# Patient Record
Sex: Male | Born: 1965 | Race: Black or African American | Hispanic: No | Marital: Single | Smoking: Current every day smoker
Health system: Southern US, Community
[De-identification: ages and names within clinical notes are randomized; demographics above are authoritative.]

## PROBLEM LIST (undated history)

## (undated) DIAGNOSIS — I10 Essential (primary) hypertension: Secondary | ICD-10-CM

---

## 2018-01-18 ENCOUNTER — Other Ambulatory Visit: Payer: Self-pay

## 2018-01-18 ENCOUNTER — Emergency Department (HOSPITAL_COMMUNITY)
Admission: EM | Admit: 2018-01-18 | Discharge: 2018-01-18 | Disposition: A | Discharge status: Home / Self Care | Payer: Self-pay | Attending: Emergency Medicine | Admitting: Emergency Medicine

## 2018-01-18 ENCOUNTER — Encounter (HOSPITAL_COMMUNITY): Payer: Self-pay

## 2018-01-18 DIAGNOSIS — I10 Essential (primary) hypertension: Secondary | ICD-10-CM | POA: Insufficient documentation

## 2018-01-18 DIAGNOSIS — F17200 Nicotine dependence, unspecified, uncomplicated: Secondary | ICD-10-CM | POA: Insufficient documentation

## 2018-01-18 DIAGNOSIS — S39012A Strain of muscle, fascia and tendon of lower back, initial encounter: Secondary | ICD-10-CM | POA: Insufficient documentation

## 2018-01-18 DIAGNOSIS — Y929 Unspecified place or not applicable: Secondary | ICD-10-CM | POA: Insufficient documentation

## 2018-01-18 DIAGNOSIS — Y990 Civilian activity done for income or pay: Secondary | ICD-10-CM | POA: Insufficient documentation

## 2018-01-18 DIAGNOSIS — Y9389 Activity, other specified: Secondary | ICD-10-CM | POA: Insufficient documentation

## 2018-01-18 DIAGNOSIS — X500XXA Overexertion from strenuous movement or load, initial encounter: Secondary | ICD-10-CM | POA: Insufficient documentation

## 2018-01-18 HISTORY — DX: Essential (primary) hypertension: I10

## 2018-01-18 MED ORDER — IBUPROFEN 800 MG PO TABS: 800.0000 mg | ORAL_TABLET | Freq: Four times a day (QID) | ORAL | 0 refills | Status: DC | PRN

## 2018-01-18 MED ORDER — CYCLOBENZAPRINE HCL 10 MG PO TABS: 10.0000 mg | ORAL_TABLET | Freq: Three times a day (TID) | ORAL | 0 refills | Status: AC | PRN
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
## 2018-01-18 NOTE — ED Provider Notes (Signed)
[facility]-EMERGENCY DEPT Provider Note   CSN: [id number] Arrival date & time: 01/18/18  1921     History   Chief Complaint Chief Complaint  Patient presents with  . Back Pain    HPI Vincent Roman is a 52 y.o. male.  Patient presents to the emergency department for evaluation of back pain.  He reports that he has been having aching pain in the lower back for the last week.  He denies any direct injury.  He does, however, report that he lifts boxes all day at work and this causes his pain to worsen.  Pain does not radiate to the lower back, no weakness in the legs.  No numbness, tingling, no change in bowel or bladder function.  He thinks he needs a note for light duty so he can heel.     Past Medical History:  Diagnosis Date  . Hypertension     There are no active problems to display for this patient.   History reviewed. No pertinent surgical history.      Home Medications    Prior to Admission medications   Medication Sig Start Date End Date Taking? Authorizing Provider  cyclobenzaprine (FLEXERIL) 10 MG tablet Take 1 tablet (10 mg total) by mouth 3 (three) times daily as needed for muscle spasms. 01/18/18   Gilda CreasePollina, Clorene Nerio J, MD  ibuprofen (ADVIL,MOTRIN) 800 MG tablet Take 1 tablet (800 mg total) by mouth every 6 (six) hours as needed for moderate pain. 01/18/18   Gilda CreasePollina, Rubylee Zamarripa J, MD    Family History History reviewed. No pertinent family history.  Social History Social History   Tobacco Use  . Smoking status: Current Every Day Smoker  . Smokeless tobacco: Never Used  Substance Use Topics  . Alcohol use: Not on file  . Drug use: Not on file     Allergies   Patient has no known allergies.   Review of Systems Review of Systems  Musculoskeletal: Positive for back pain.  All other systems reviewed and are negative.    Physical Exam Updated Vital Signs BP (!) 146/98 (BP Location: Right Arm)   Pulse 71   Temp 98.1  F (36.7 C) (Oral)   Resp 14   SpO2 100%   Physical Exam  Constitutional: He is oriented to person, place, and time. He appears well-developed and well-nourished. No distress.  HENT:  Head: Normocephalic and atraumatic.  Right Ear: Hearing normal.  Left Ear: Hearing normal.  Nose: Nose normal.  Mouth/Throat: Oropharynx is clear and moist and mucous membranes are normal.  Eyes: Pupils are equal, round, and reactive to light. Conjunctivae and EOM are normal.  Neck: Normal range of motion. Neck supple.  Cardiovascular: Regular rhythm, S1 normal and S2 normal. Exam reveals no gallop and no friction rub.  No murmur heard. Pulmonary/Chest: Effort normal and breath sounds normal. No respiratory distress. He exhibits no tenderness.  Abdominal: Soft. Normal appearance and bowel sounds are normal. There is no hepatosplenomegaly. There is no tenderness. There is no rebound, no guarding, no tenderness at McBurney's point and negative Murphy's sign. No hernia.  Musculoskeletal: Normal range of motion.       Lumbar back: He exhibits tenderness.       Back:  Neurological: He is alert and oriented to person, place, and time. He has normal strength. No cranial nerve deficit or sensory deficit. Coordination normal. GCS eye subscore is 4. GCS verbal subscore is 5. GCS motor subscore is 6.  Skin: Skin is  warm, dry and intact. No rash noted. No cyanosis.  Psychiatric: He has a normal mood and affect. His speech is normal and behavior is normal. Thought content normal.  Nursing note and vitals reviewed.    ED Treatments / Results  Labs (all labs ordered are listed, but only abnormal results are displayed) Labs Reviewed - No data to display  EKG None  Radiology No results found.  Procedures Procedures (including critical care time)  Medications Ordered in ED Medications - No data to display   Initial Impression / Assessment and Plan / ED Course  I have reviewed the triage vital signs and  the nursing notes.  Pertinent labs & imaging results that were available during my care of the patient were reviewed by me and considered in my medical decision making (see chart for details).     Patient presents to the ER with musculoskeletal back pain. Examination reveals back tenderness without any associated neurologic findings. Patient's strength, sensation and reflexes were normal. There is no evidence of saddle anesthesia. Patient does not have a foot drop. Patient has not experienced any change in bowel or bladder function. As such, patient did not require any imaging or further studies. Patient was treated with analgesia.  Final Clinical Impressions(s) / ED Diagnoses   Final diagnoses:  Strain of lumbar region, initial encounter    ED Discharge Orders         Ordered    ibuprofen (ADVIL,MOTRIN) 800 MG tablet  Every 6 hours PRN     01/18/18 2314    cyclobenzaprine (FLEXERIL) 10 MG tablet  3 times daily PRN     01/18/18 2314           Gilda Crease, MD 01/18/18 2314

## 2018-01-30 ENCOUNTER — Emergency Department (HOSPITAL_COMMUNITY)
Admission: EM | Admit: 2018-01-30 | Discharge: 2018-01-30 | Disposition: A | Discharge status: Home / Self Care | Payer: Self-pay | Attending: Emergency Medicine | Admitting: Emergency Medicine

## 2018-01-30 ENCOUNTER — Other Ambulatory Visit: Payer: Self-pay

## 2018-01-30 ENCOUNTER — Encounter (HOSPITAL_COMMUNITY): Payer: Self-pay | Admitting: *Deleted

## 2018-01-30 DIAGNOSIS — M5442 Lumbago with sciatica, left side: Secondary | ICD-10-CM | POA: Insufficient documentation

## 2018-01-30 DIAGNOSIS — Z79899 Other long term (current) drug therapy: Secondary | ICD-10-CM | POA: Insufficient documentation

## 2018-01-30 DIAGNOSIS — I10 Essential (primary) hypertension: Secondary | ICD-10-CM | POA: Insufficient documentation

## 2018-01-30 DIAGNOSIS — F17210 Nicotine dependence, cigarettes, uncomplicated: Secondary | ICD-10-CM | POA: Insufficient documentation

## 2018-01-30 MED ORDER — DICLOFENAC EPOLAMINE 1.3 % TD PTCH
1.0000 | MEDICATED_PATCH | Freq: Two times a day (BID) | TRANSDERMAL | Status: DC
  Administered 2018-01-30: 1 via TRANSDERMAL
  Filled 2018-01-30: qty 1

## 2018-01-30 MED ORDER — TRAMADOL HCL 50 MG PO TABS
50.0000 mg | ORAL_TABLET | Freq: Once | ORAL | Status: AC
  Administered 2018-01-30: 50 mg via ORAL
  Filled 2018-01-30: qty 1

## 2018-01-30 MED ORDER — PREDNISONE 20 MG PO TABS
60.0000 mg | ORAL_TABLET | ORAL | Status: AC
  Administered 2018-01-30: 60 mg via ORAL
  Filled 2018-01-30: qty 3

## 2018-01-30 MED ORDER — TRAMADOL HCL 50 MG PO TABS: 50.0000 mg | ORAL_TABLET | Freq: Four times a day (QID) | ORAL | 0 refills | Status: AC | PRN

## 2018-01-30 MED ORDER — PREDNISONE 20 MG PO TABS: 40.0000 mg | ORAL_TABLET | Freq: Every day | ORAL | 0 refills | Status: AC

## 2018-01-30 NOTE — ED Provider Notes (Signed)
[facility]-EMERGENCY DEPT Provider Note   CSN: [id number] Arrival date & time: 01/30/18  2116     History   Chief Complaint Chief Complaint  Patient presents with  . Back Pain    HPI Vincent Roman is a 52 y.o. male.  HPI   Presents after sudden onset left lower back pain. Onset was after lifting a heavy object. Since onset pain is been sharp, severe, with mild radiation down the left leg. No abdominal pain, no urinary complaints, no fever, chills. Patient has history of prior exacerbation similar, but had been well prior to this episode. Since onset no relief with OTC medication.  Past Medical History:  Diagnosis Date  . Hypertension     There are no active problems to display for this patient.   History reviewed. No pertinent surgical history.      Home Medications    Prior to Admission medications   Medication Sig Start Date End Date Taking? Authorizing Provider  cyclobenzaprine (FLEXERIL) 10 MG tablet Take 1 tablet (10 mg total) by mouth 3 (three) times daily as needed for muscle spasms. 01/18/18   Gilda Crease, MD  ibuprofen (ADVIL,MOTRIN) 800 MG tablet Take 1 tablet (800 mg total) by mouth every 6 (six) hours as needed for moderate pain. 01/18/18   Gilda Crease, MD  predniSONE (DELTASONE) 20 MG tablet Take 2 tablets (40 mg total) by mouth daily with breakfast. For the next four days 01/30/18   Gerhard Munch, MD  traMADol (ULTRAM) 50 MG tablet Take 1 tablet (50 mg total) by mouth every 6 (six) hours as needed. 01/30/18   Gerhard Munch, MD    Family History No family history on file.  Social History Social History   Tobacco Use  . Smoking status: Current Every Day Smoker  . Smokeless tobacco: Never Used  Substance Use Topics  . Alcohol use: Not on file  . Drug use: Not on file     Allergies   Patient has no known allergies.   Review of Systems Review of Systems  Constitutional:       Per HPI,  otherwise negative  HENT:       Per HPI, otherwise negative  Respiratory:       Per HPI, otherwise negative  Cardiovascular:       Per HPI, otherwise negative  Gastrointestinal: Negative for vomiting.  Endocrine:       Negative aside from HPI  Genitourinary:       Neg aside from HPI   Musculoskeletal:       Per HPI, otherwise negative  Skin: Negative.   Neurological: Negative for syncope.     Physical Exam Updated Vital Signs BP (!) 180/108   Pulse (!) 58   Temp 97.8 F (36.6 C) (Oral)   Resp 17   SpO2 100%   Physical Exam  Constitutional: He is oriented to person, place, and time. He appears well-developed. No distress.  HENT:  Head: Normocephalic and atraumatic.  Eyes: Conjunctivae and EOM are normal.  Cardiovascular: Normal rate and regular rhythm.  Pulmonary/Chest: Effort normal. No stridor. No respiratory distress.  Abdominal: He exhibits no distension.  Musculoskeletal: He exhibits no edema.  Patient can flex each hip independently, but has pain referred to the left lower back greater on the left than the right.  Neurological: He is alert and oriented to person, place, and time.  Skin: Skin is warm and dry.  Psychiatric: He has a normal mood and affect.  Nursing note and vitals reviewed.    ED Treatments / Results   Procedures Procedures (including critical care time)  Medications Ordered in ED Medications  diclofenac (FLECTOR) 1.3 % 1 patch (has no administration in time range)  predniSONE (DELTASONE) tablet 60 mg (has no administration in time range)  traMADol (ULTRAM) tablet 50 mg (has no administration in time range)     Initial Impression / Assessment and Plan / ED Course  I have reviewed the triage vital signs and the nursing notes.  Pertinent labs & imaging results that were available during my care of the patient were reviewed by me and considered in my medical decision making (see chart for details).     Patient presents with episode of  acute onset left lower back pain with radicular features. Patient is neurovascular intact, has no red flags for CNS injury. Patient likely having exacerbation of her radicular lesions, given his pain, reproducible pain with hip flexion, with reassuring physical exam otherwise, patient started on anti-inflammatories, topical and oral, pain medication and provided resources to follow-up.  Final Clinical Impressions(s) / ED Diagnoses   Final diagnoses:  Acute left-sided low back pain with left-sided sciatica    ED Discharge Orders         Ordered    predniSONE (DELTASONE) 20 MG tablet  Daily with breakfast     01/30/18 2305    traMADol (ULTRAM) 50 MG tablet  Every 6 hours PRN     01/30/18 2305           Gerhard Munch, MD 01/30/18 2313

## 2018-01-30 NOTE — Discharge Instructions (Signed)
As discussed, your evaluation today has been largely reassuring.  But, it is important that you monitor your condition carefully, and do not hesitate to return to the ED if you develop new, or concerning changes in your condition.  In addition to the prescribed medication, please obtain a topical medication patches including the ingredient dimethyl salicylate. You may need to request the assistance of the pharmacist to obtain his, but they are available over-the-counter.

## 2018-01-30 NOTE — ED Triage Notes (Signed)
Pt reports onset of right lower back pain while at work tonight, cannot recall specific injury to the area. Ambulatory to triage with steady gait.

## 2018-02-18 ENCOUNTER — Emergency Department (HOSPITAL_COMMUNITY)
Admission: EM | Admit: 2018-02-18 | Discharge: 2018-02-18 | Disposition: A | Discharge status: Home / Self Care | Payer: Self-pay | Attending: Emergency Medicine | Admitting: Emergency Medicine

## 2018-02-18 ENCOUNTER — Other Ambulatory Visit: Payer: Self-pay

## 2018-02-18 DIAGNOSIS — I10 Essential (primary) hypertension: Secondary | ICD-10-CM | POA: Insufficient documentation

## 2018-02-18 DIAGNOSIS — Z79899 Other long term (current) drug therapy: Secondary | ICD-10-CM | POA: Insufficient documentation

## 2018-02-18 DIAGNOSIS — J069 Acute upper respiratory infection, unspecified: Secondary | ICD-10-CM

## 2018-02-18 DIAGNOSIS — F17200 Nicotine dependence, unspecified, uncomplicated: Secondary | ICD-10-CM | POA: Insufficient documentation

## 2018-02-18 DIAGNOSIS — B9789 Other viral agents as the cause of diseases classified elsewhere: Secondary | ICD-10-CM

## 2018-02-18 NOTE — ED Provider Notes (Signed)
[facility]-EMERGENCY DEPT Provider Note   CSN: [id number] Arrival date & time: 02/18/18  1537     History   Chief Complaint Chief Complaint  Patient presents with  . Sore Throat  . Nasal Congestion  . Cough    HPI  Vincent Roman is a 52 y.o. male who complains of sore throat, productive cough and malaise  for 1 days. He denies a history of chest pain, dizziness, fevers, myalgias, nausea, rash, shortness of breath, sweats, vomiting, weakness, weight loss and wheezing and denies a history of asthma. Patient admits to smoke cigarettes.  He states "I just need a shot or some the skin to get rid of the skull because I got a go to work."  He has not taken anything for his symptoms.  States that multiple people at his job have the same symptoms.    HPI  Past Medical History:  Diagnosis Date  . Hypertension     There are no active problems to display for this patient.   No past surgical history on file.      Home Medications    Prior to Admission medications   Medication Sig Start Date End Date Taking? Authorizing Provider  cyclobenzaprine (FLEXERIL) 10 MG tablet Take 1 tablet (10 mg total) by mouth 3 (three) times daily as needed for muscle spasms. 01/18/18   Gilda Crease, MD  ibuprofen (ADVIL,MOTRIN) 800 MG tablet Take 1 tablet (800 mg total) by mouth every 6 (six) hours as needed for moderate pain. 01/18/18   Gilda Crease, MD  predniSONE (DELTASONE) 20 MG tablet Take 2 tablets (40 mg total) by mouth daily with breakfast. For the next four days 01/30/18   Gerhard Munch, MD  traMADol (ULTRAM) 50 MG tablet Take 1 tablet (50 mg total) by mouth every 6 (six) hours as needed. 01/30/18   Gerhard Munch, MD    Family History No family history on file.  Social History Social History   Tobacco Use  . Smoking status: Current Every Day Smoker  . Smokeless tobacco: Never Used  Substance Use Topics  . Alcohol use: Not on file  .  Drug use: Not on file     Allergies   Patient has no known allergies.   Review of Systems Review of Systems Ten systems reviewed and are negative for acute change, except as noted in the HPI.     Physical Exam Updated Vital Signs BP (!) 161/94 (BP Location: Right Arm)   Pulse 60   Temp 98.8 F (37.1 C) (Oral)   Resp 18   Ht 5\' 7"  (1.702 m)   Wt 90.7 kg   SpO2 99%   BMI 31.32 kg/m   Physical Exam  Constitutional: He is oriented to person, place, and time. He appears well-developed and well-nourished. No distress.  HENT:  Head: Normocephalic and atraumatic.  Right Ear: Tympanic membrane and ear canal normal.  Left Ear: Tympanic membrane and ear canal normal.  Mouth/Throat: Uvula is midline, oropharynx is clear and moist and mucous membranes are normal. No oral lesions. No uvula swelling. No oropharyngeal exudate or posterior oropharyngeal erythema.  Eyes: Pupils are equal, round, and reactive to light. Conjunctivae and EOM are normal. No scleral icterus.  Neck: Normal range of motion. Neck supple.  Cardiovascular: Normal rate, regular rhythm and normal heart sounds.  Pulmonary/Chest: Effort normal. No respiratory distress. He has no wheezes. He has no rhonchi. He has no rales.  Abdominal: Soft. There is no tenderness.  Musculoskeletal: He exhibits no edema.  Neurological: He is alert and oriented to person, place, and time.  Skin: Skin is warm and dry. Capillary refill takes less than 2 seconds. He is not diaphoretic.  Psychiatric: His behavior is normal.  Nursing note and vitals reviewed.    ED Treatments / Results  Labs (all labs ordered are listed, but only abnormal results are displayed) Labs Reviewed - No data to display  EKG None  Radiology No results found.  Procedures Procedures (including critical care time)  Medications Ordered in ED Medications - No data to display   Initial Impression / Assessment and Plan / ED Course  I have reviewed the  triage vital signs and the nursing notes.  Pertinent labs & imaging results that were available during my care of the patient were reviewed by me and considered in my medical decision making (see chart for details).      Patients symptoms are consistent with URI, likely viral etiology. Discussed that antibiotics are not indicated for viral infections. Pt will be discharged with symptomatic treatment.  Verbalizes understanding and is agreeable with plan. Pt is hemodynamically stable & in NAD prior to dc.   Final Clinical Impressions(s) / ED Diagnoses   Final diagnoses:  Viral URI with cough    ED Discharge Orders    None       Arthor Captain, PA-C 02/18/18 2202    Maia Plan, MD 02/19/18 [phone]

## 2018-02-18 NOTE — ED Notes (Signed)
Pt is alert and oriented x 4 and is verbally responsive. Pt report a 7/10 sore throat pain x 2 days. Pt throat appears mildly red on the left side.

## 2018-02-18 NOTE — Discharge Instructions (Addendum)
Use DayQuil/NyQuil for symptom relief. Follow-up with your primary care physician.  Use immune system boosting such as vitamin C and zinc.  There are multiple available products over-the-counter and you may ask any pharmacist for help with these medications.

## 2018-02-18 NOTE — ED Triage Notes (Signed)
Patient arrives with c/o sore throat and nasal congestion that started yesterday. Patient also reports a mild cough with clear sputum production. Patient denies fevers.

## 2019-10-15 ENCOUNTER — Encounter (HOSPITAL_COMMUNITY): Payer: Self-pay | Admitting: Emergency Medicine

## 2019-10-15 ENCOUNTER — Emergency Department (HOSPITAL_COMMUNITY): Payer: Self-pay

## 2019-10-15 ENCOUNTER — Emergency Department (HOSPITAL_COMMUNITY)
Admission: EM | Admit: 2019-10-15 | Discharge: 2019-10-15 | Disposition: A | Discharge status: Home / Self Care | Payer: Self-pay | Attending: Emergency Medicine | Admitting: Emergency Medicine

## 2019-10-15 DIAGNOSIS — S3992XA Unspecified injury of lower back, initial encounter: Secondary | ICD-10-CM | POA: Insufficient documentation

## 2019-10-15 DIAGNOSIS — I10 Essential (primary) hypertension: Secondary | ICD-10-CM | POA: Insufficient documentation

## 2019-10-15 DIAGNOSIS — M545 Low back pain, unspecified: Secondary | ICD-10-CM

## 2019-10-15 DIAGNOSIS — Y990 Civilian activity done for income or pay: Secondary | ICD-10-CM | POA: Insufficient documentation

## 2019-10-15 DIAGNOSIS — Y9389 Activity, other specified: Secondary | ICD-10-CM | POA: Insufficient documentation

## 2019-10-15 DIAGNOSIS — Y929 Unspecified place or not applicable: Secondary | ICD-10-CM | POA: Insufficient documentation

## 2019-10-15 DIAGNOSIS — X500XXA Overexertion from strenuous movement or load, initial encounter: Secondary | ICD-10-CM | POA: Insufficient documentation

## 2019-10-15 DIAGNOSIS — F17200 Nicotine dependence, unspecified, uncomplicated: Secondary | ICD-10-CM | POA: Insufficient documentation

## 2019-10-15 MED ORDER — IBUPROFEN 800 MG PO TABS: 800.0000 mg | ORAL_TABLET | Freq: Three times a day (TID) | ORAL | 0 refills | Status: AC

## 2019-10-15 MED ORDER — LIDOCAINE 5 % EX PTCH
1.0000 | MEDICATED_PATCH | CUTANEOUS | Status: DC
  Administered 2019-10-15: 1 via TRANSDERMAL
  Filled 2019-10-15: qty 1

## 2019-10-15 MED ORDER — KETOROLAC TROMETHAMINE 30 MG/ML IJ SOLN
30.0000 mg | Freq: Once | INTRAMUSCULAR | Status: AC
  Administered 2019-10-15: 30 mg via INTRAMUSCULAR
  Filled 2019-10-15: qty 1

## 2019-10-15 MED ORDER — METHOCARBAMOL 500 MG PO TABS: 500.0000 mg | ORAL_TABLET | Freq: Two times a day (BID) | ORAL | 0 refills | Status: AC

## 2019-10-15 NOTE — ED Provider Notes (Signed)
[facility] DEPT Provider Note   CSN: [id number] Arrival date & time: 10/15/19  7829     History Chief Complaint  Patient presents with  . Back Pain  . Medication Refill    Vincent Roman is a 54 y.o. male.  Vincent Roman is a 54 y.o. male with a history of hypertension, who presents to the ED for evaluation after back injury.  He reports that about a week ago he was lifting heavy pallets at work and started to have pain in the middle of his low back.  He reports that they put him on light duty for 2 days and his back pain seems to be improving but then they put him back in his usual site lifting heavy pallets and his back pain started to worsen again.  He reports pain is localized in the middle of his low back it does not radiate and does not shoot into the legs.  He denies any associated numbness, weakness or tingling.  No associated abdominal pain or urinary symptoms.  No loss of bowel or bladder control or saddle anesthesia.  No history of cancer or IV drug use.  No prior history of back injury or surgery.  He has been taking ibuprofen with some relief, has not tried anything else to treat his symptoms.  No other aggravating or alleviating factors.        Past Medical History:  Diagnosis Date  . Hypertension     There are no problems to display for this patient.   History reviewed. No pertinent surgical history.     No family history on file.  Social History   Tobacco Use  . Smoking status: Current Every Day Smoker  . Smokeless tobacco: Never Used  Substance Use Topics  . Alcohol use: Not on file  . Drug use: Not on file    Home Medications Prior to Admission medications   Medication Sig Start Date End Date Taking? Authorizing Provider  cyclobenzaprine (FLEXERIL) 10 MG tablet Take 1 tablet (10 mg total) by mouth 3 (three) times daily as needed for muscle spasms. 01/18/18   Orpah Greek, MD  ibuprofen (ADVIL) 800 MG  tablet Take 1 tablet (800 mg total) by mouth 3 (three) times daily. 10/15/19   Jacqlyn Larsen, PA-C  methocarbamol (ROBAXIN) 500 MG tablet Take 1 tablet (500 mg total) by mouth 2 (two) times daily. 10/15/19   Jacqlyn Larsen, PA-C  predniSONE (DELTASONE) 20 MG tablet Take 2 tablets (40 mg total) by mouth daily with breakfast. For the next four days 01/30/18   Carmin Muskrat, MD  traMADol (ULTRAM) 50 MG tablet Take 1 tablet (50 mg total) by mouth every 6 (six) hours as needed. 01/30/18   Carmin Muskrat, MD    Allergies    Patient has no known allergies.  Review of Systems   Review of Systems  Constitutional: Negative for chills and fever.  HENT: Negative.   Respiratory: Negative for shortness of breath.   Cardiovascular: Negative for chest pain.  Gastrointestinal: Negative for abdominal pain, constipation, diarrhea, nausea and vomiting.  Genitourinary: Negative for dysuria, flank pain, frequency and hematuria.  Musculoskeletal: Positive for back pain. Negative for arthralgias, gait problem, joint swelling, myalgias and neck pain.  Skin: Negative for color change, rash and wound.  Neurological: Negative for weakness and numbness.    Physical Exam Updated Vital Signs BP (!) 150/90 (BP Location: Left Arm) Comment: out of meds  Pulse 72   Temp 98.1  F (36.7 C) (Oral)   Resp 16   SpO2 100%   Physical Exam Vitals and nursing note reviewed.  Constitutional:      General: He is not in acute distress.    Appearance: He is well-developed. He is not diaphoretic.  HENT:     Head: Atraumatic.  Eyes:     General:        Right eye: No discharge.        Left eye: No discharge.  Cardiovascular:     Pulses:          Radial pulses are 2+ on the right side and 2+ on the left side.       Dorsalis pedis pulses are 2+ on the right side and 2+ on the left side.       Posterior tibial pulses are 2+ on the right side and 2+ on the left side.  Pulmonary:     Effort: Pulmonary effort is normal. No  respiratory distress.  Abdominal:     General: Bowel sounds are normal. There is no distension.     Palpations: Abdomen is soft. There is no mass.     Tenderness: There is no abdominal tenderness. There is no guarding.     Comments: Abdomen soft, nondistended, nontender to palpation in all quadrants without guarding or peritoneal signs, no CVA tenderness bilaterally  Musculoskeletal:     Cervical back: Neck supple.     Comments: Tenderness to palpation over midline low back pain without palpable deformity or stepoff.  Pain made worse with range of motion of the lower extremities, but neg straight leg raise  Skin:    General: Skin is warm and dry.     Capillary Refill: Capillary refill takes less than 2 seconds.  Neurological:     Mental Status: He is alert and oriented to person, place, and time.     Comments: Alert, clear speech, following commands. Moving all extremities without difficulty. Bilateral lower extremities with 5/5 strength in proximal and distal muscle groups and with dorsi and plantar flexion. Sensation intact in bilateral lower extremities. Ambulatory with steady gait  Psychiatric:        Mood and Affect: Mood normal.        Behavior: Behavior normal.     ED Results / Procedures / Treatments   Labs (all labs ordered are listed, but only abnormal results are displayed) Labs Reviewed - No data to display  EKG None  Radiology DG Lumbar Spine Complete  Result Date: 10/15/2019 CLINICAL DATA:  Back pain. EXAM: LUMBAR SPINE - COMPLETE 4+ VIEW COMPARISON:  None. FINDINGS: There is no acute displaced fracture. No significant malalignment. There are advanced degenerative changes at the L4-L5 and L5-S1 levels. There is facet arthrosis at the lower lumbar segments. Phleboliths project over the patient's pelvis. IMPRESSION: No acute displaced fracture or significant malalignment. 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 Electronically Signed   By: Katherine Mantle  M.D.   On: 10/15/2019 19:45    Procedures Procedures (including critical care time)  Medications Ordered in ED Medications  ketorolac (TORADOL) 30 MG/ML injection 30 mg (has no administration in time range)  lidocaine (LIDODERM) 5 % 1 patch (has no administration in time range)    ED Course  I have reviewed the triage vital signs and the nursing notes.  Pertinent labs & imaging results that were available during my care of the patient were reviewed by me and considered in my medical decision making (see chart  for details).    MDM Rules/Calculators/A&P                          Patient with back pain.  No neurological deficits and normal neuro exam.  Patient can walk but states is painful.  No loss of bowel or bladder control.  No concern for cauda equina.  No fever, night sweats, weight loss, h/o cancer, IVDU.  X-ray without evidence of acute fracture or malalignment, does show some degenerative changes.  RICE protocol and pain medicine indicated and discussed with patient.   Final Clinical Impression(s) / ED Diagnoses Final diagnoses:  Acute midline low back pain without sciatica    Rx / DC Orders ED Discharge Orders         Ordered    ibuprofen (ADVIL) 800 MG tablet  3 times daily     Discontinue  Reprint     10/15/19 2005    methocarbamol (ROBAXIN) 500 MG tablet  2 times daily     Discontinue  Reprint     10/15/19 2005           Dartha Lodge, Cordelia Poche 10/15/19 2007    Terald Sleeper, MD 10/16/19 1037

## 2019-10-15 NOTE — Discharge Instructions (Signed)
You were seen here today for Back Pain: Low back pain is discomfort in the lower back that may be due to injuries to muscles and ligaments around the spine. Occasionally, it may be caused by a problem to a part of the spine called a disc. Your back pain should be treated with medicines listed below as well as back exercises and this back pain should get better over the next 2 weeks. Most patients get completely well in 4 weeks. It is important to know however, if you develop severe or worsening pain, low back pain with fever, numbness, weakness or inability to walk or urinate, you should return to the ER immediately.  Please follow up with your doctor this week for a recheck if still having symptoms.  HOME INSTRUCTIONS Self - care:  The application of heat can help soothe the pain.  Maintaining your daily activities, including walking (this is encouraged), as it will help you get better faster than just staying in bed. Do not life, push, pull anything more than 10 pounds for the next week. I am attaching back exercises that you can do at home to help facilitate your recovery.   Back Exercises - I have attached a handout on back exercises that can be done at home to help facilitate your recovery.   Medications are also useful to help with pain control.   Acetaminophen.  This medication is generally safe, and found over the counter. Take as directed for your age. You should not take more than 8 of the extra strength (500mg ) pills a day (max dose is 4000mg  total OVER one day)  Non steroidal anti inflammatory: This includes medications including Ibuprofen, naproxen and Mobic; These medications help both pain and swelling and are very useful in treating back pain.  They should be taken with food, as they can cause stomach upset, and more seriously, stomach bleeding. Do not combine the medications.   Lidocaine Patch: Salon Pas lidocaine patches (blue and silver box) can be purchased over the counter and worn  for 12 hours for local pain relief   Muscle relaxants:  These medications can help with muscle tightness that is a cause of lower back pain.  Most of these medications can cause drowsiness, and it is not safe to drive or use dangerous machinery while taking them. They are primarily helpful when taken at night before sleep.   You will need to follow up with your primary healthcare provider  or the Orthopedist in 1-2 weeks for reassessment and persistent symptoms.  Be aware that if you develop new symptoms, such as a fever, leg weakness, difficulty with or loss of control of your urine or bowels, abdominal pain, or more severe pain, you will need to seek medical attention and/or return to the Emergency department. Additional Information:  Your vital signs today were: BP (!) 150/90 (BP Location: Left Arm) Comment: out of meds  Pulse 72   Temp 98.1 F (36.7 C) (Oral)   Resp 16   SpO2 100%  If your blood pressure (BP) was elevated above 135/85 this visit, please have this repeated by your doctor within one month. ---------------

## 2019-10-21 ENCOUNTER — Telehealth: Payer: Self-pay | Admitting: *Deleted

## 2019-10-21 NOTE — Telephone Encounter (Signed)
TOC CM  Received call from pt stating he did not get his RX for Lisinopril 20 mg. States he recently moved to [place] to stay at the [facility] [place] and he is working. He was going to [facility][phone]. Contacted pharmacy and they transferred his Rx to [place] on [address]. He has 2 refills left on medication. Called [facility] and medication is ready. Appt arranged for 7/8 at 930 am at [facility]. Called pt with time. States he does plan to go to appt. ED provider updated. Isidoro Donning RN CCM, [facility] ED TOC CM [phone]

## 2019-11-11 ENCOUNTER — Inpatient Hospital Stay (INDEPENDENT_AMBULATORY_CARE_PROVIDER_SITE_OTHER): Payer: Self-pay | Admitting: Primary Care

## 2021-10-26 IMAGING — CR DG LUMBAR SPINE COMPLETE 4+V
5 series · 5 of 5 positions shown · non-contrast
Comparison: None.

CLINICAL DATA: Back pain.

EXAM:
LUMBAR SPINE - COMPLETE 4+ VIEW

[t lumbar spine ap]
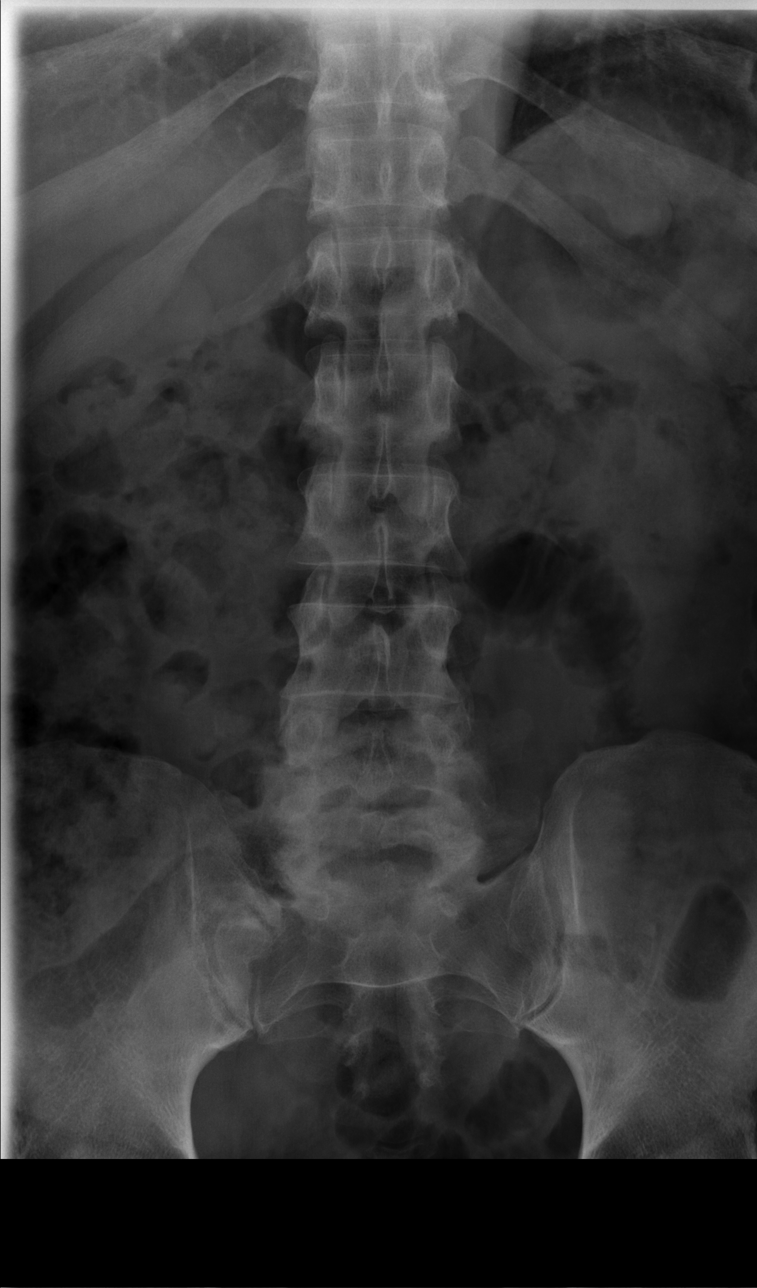

[t lumbar spine obl (1 of 2)]
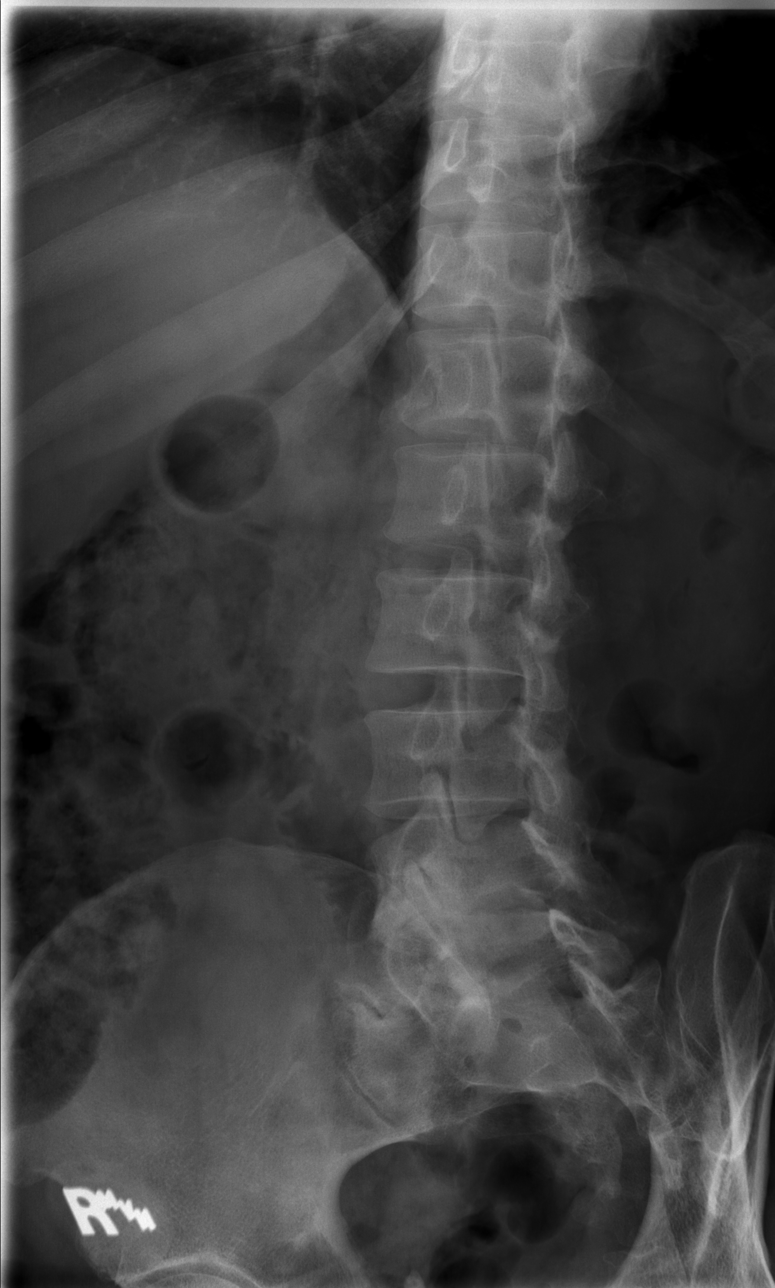

[t lumbar spine obl (2 of 2)]
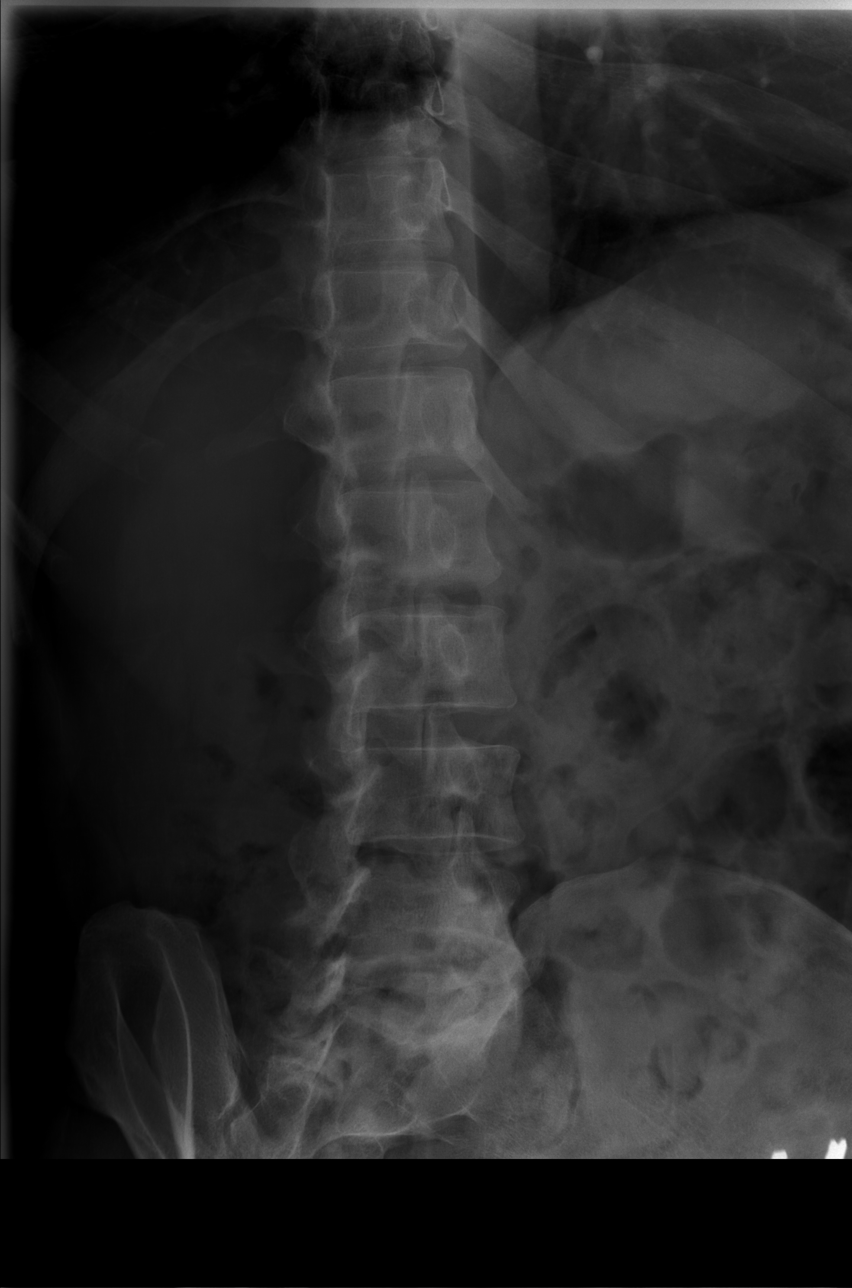

[t lumbar spine lat]
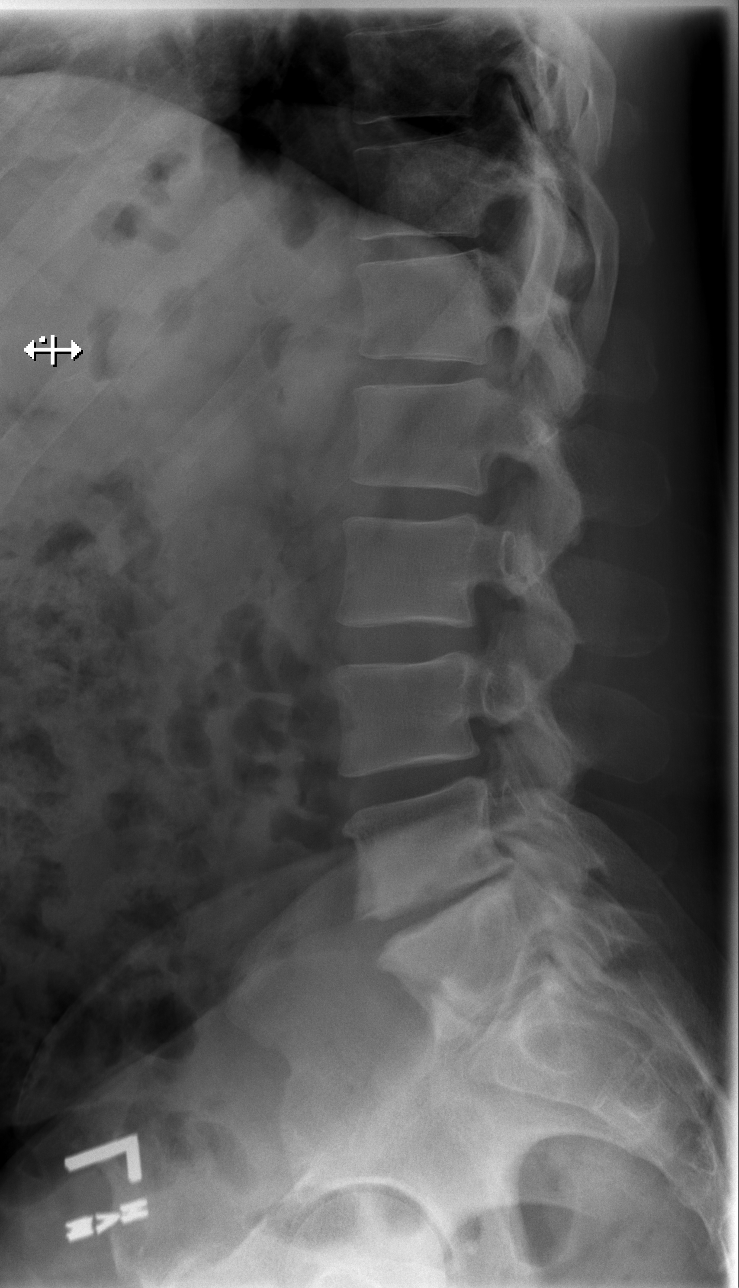

[t lumbar l-5 s-1 spot]
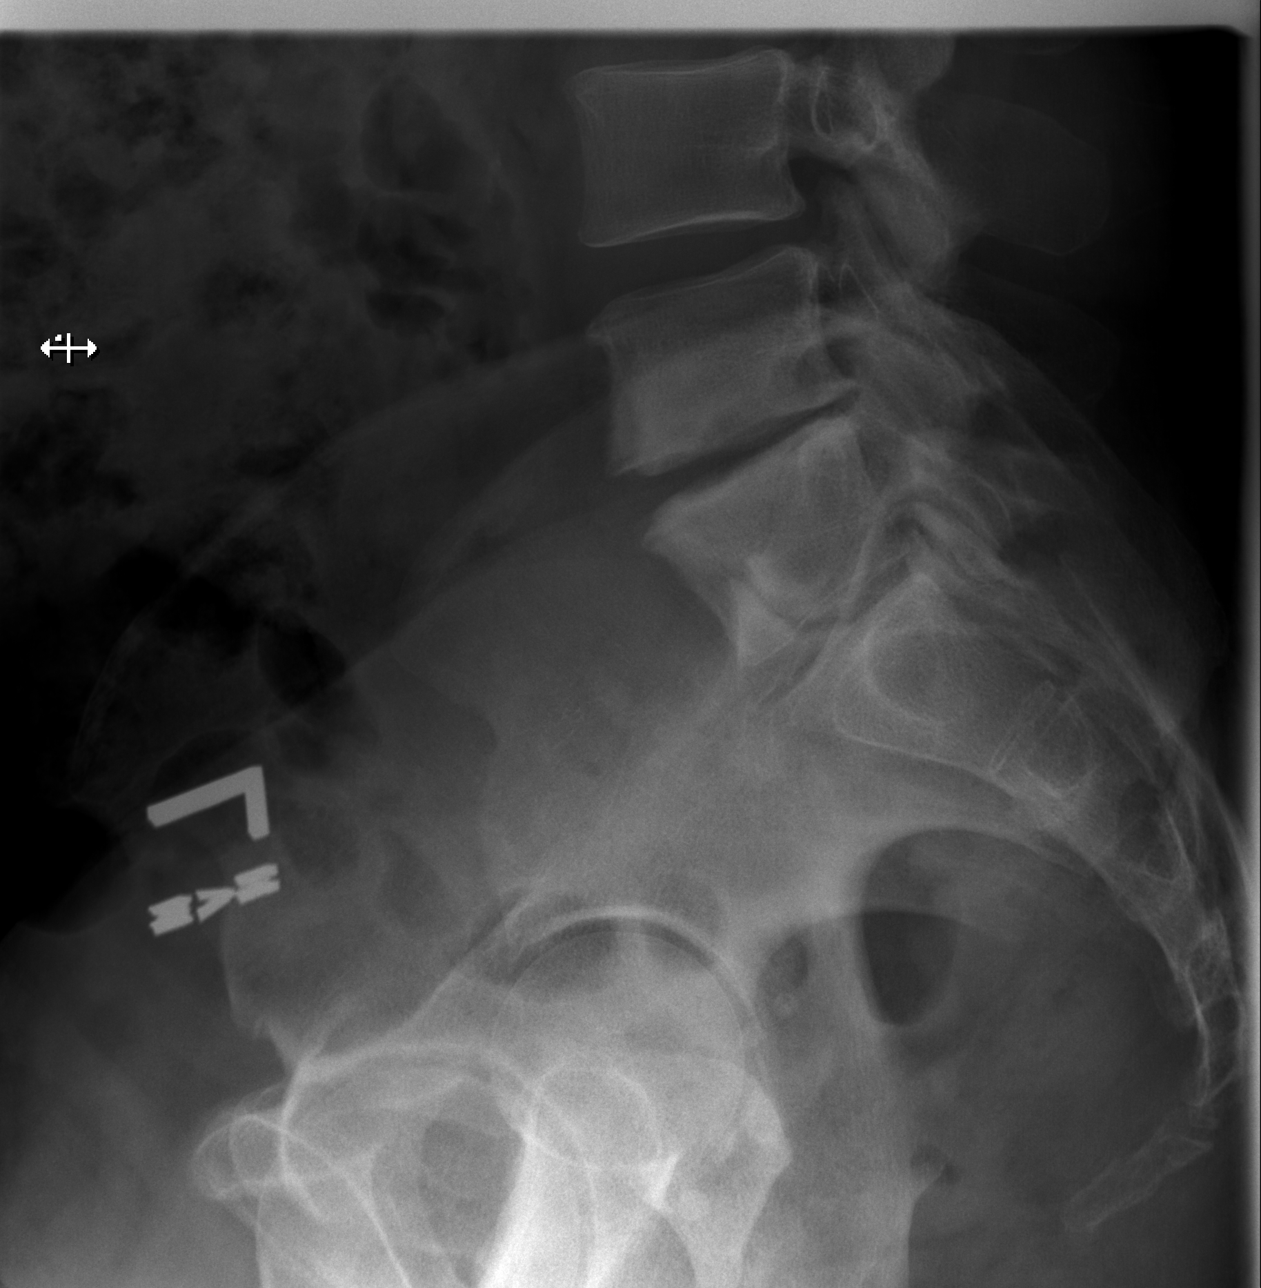

[5 of 5 positions shown; findings below may reference images not displayed]

FINDINGS: There is no acute displaced fracture. No significant malalignment.
There are advanced degenerative changes at the L4-L5 and L5-S1
levels. There is facet arthrosis at the lower lumbar segments.
Phleboliths project over the patient's pelvis.
IMPRESSION: No acute displaced fracture or significant malalignment. Advanced
degenerative changes at L4-L5 and L5-S1.
